# Patient Record
Sex: Male | Born: 1965 | Race: Asian | Hispanic: No | Marital: Married | Smoking: Never smoker
Health system: Southern US, Community
[De-identification: ages and names within clinical notes are randomized; demographics above are authoritative.]

## PROBLEM LIST (undated history)

## (undated) DIAGNOSIS — I2510 Atherosclerotic heart disease of native coronary artery without angina pectoris: Secondary | ICD-10-CM

## (undated) DIAGNOSIS — Q230 Congenital stenosis of aortic valve: Secondary | ICD-10-CM

## (undated) DIAGNOSIS — Z951 Presence of aortocoronary bypass graft: Secondary | ICD-10-CM

## (undated) DIAGNOSIS — Q231 Congenital insufficiency of aortic valve: Secondary | ICD-10-CM

## (undated) DIAGNOSIS — Z952 Presence of prosthetic heart valve: Secondary | ICD-10-CM

## (undated) HISTORY — DX: Congenital stenosis of aortic valve: Q23.0

## (undated) HISTORY — DX: Presence of prosthetic heart valve: Z95.2

## (undated) HISTORY — DX: Presence of aortocoronary bypass graft: Z95.1

## (undated) HISTORY — DX: Atherosclerotic heart disease of native coronary artery without angina pectoris: I25.10

## (undated) HISTORY — DX: Congenital insufficiency of aortic valve: Q23.1

---

## 2017-05-22 HISTORY — PX: OTHER SURGICAL HISTORY: SHX169

## 2017-06-06 DIAGNOSIS — Z952 Presence of prosthetic heart valve: Secondary | ICD-10-CM

## 2017-06-06 HISTORY — DX: Presence of prosthetic heart valve: Z95.2

## 2017-06-08 HISTORY — PX: AORTIC VALVE REPLACEMENT (AVR)/CORONARY ARTERY BYPASS GRAFTING (CABG): SHX5725

## 2017-08-01 ENCOUNTER — Ambulatory Visit (INDEPENDENT_AMBULATORY_CARE_PROVIDER_SITE_OTHER): Payer: 59 | Admitting: Cardiovascular Disease

## 2017-08-01 ENCOUNTER — Encounter: Payer: Self-pay | Admitting: Cardiovascular Disease

## 2017-08-01 ENCOUNTER — Telehealth: Payer: Self-pay

## 2017-08-01 DIAGNOSIS — I2510 Atherosclerotic heart disease of native coronary artery without angina pectoris: Secondary | ICD-10-CM

## 2017-08-01 DIAGNOSIS — I350 Nonrheumatic aortic (valve) stenosis: Secondary | ICD-10-CM | POA: Diagnosis not present

## 2017-08-01 DIAGNOSIS — Z951 Presence of aortocoronary bypass graft: Secondary | ICD-10-CM | POA: Diagnosis not present

## 2017-08-01 NOTE — Telephone Encounter (Signed)
Faxed notes to nl 

## 2017-08-01 NOTE — Assessment & Plan Note (Signed)
Mr. [place] had coronary artery bypass grafting 4 on 06/06/17 [facility] with a LIMA to his LAD, vein to obtuse marginal branch, ramus branch and PDA.. This was done 3 days after cardiac catheterization revealed left main/three-vessel disease. His EF improved from 46% preoperative to 55%.Marland Kitchen. He did complete cardiac rehabilitation and is minimally symptomatic now

## 2017-08-01 NOTE — Patient Instructions (Signed)
Medication Instructions: Your physician recommends that you continue on your current medications as directed. Please refer to the Current Medication list given to you today.  Labwork: Your physician recommends that you return for a FASTING lipid profile and hepatic function panel at your earliest convenience.  Testing/Procedures: Your physician has requested that you have an echocardiogram. Echocardiography is a painless test that uses sound waves to create images of your heart. It provides your doctor with information about the size and shape of your heart and how well your heart's chambers and valves are working. This procedure takes approximately one hour. There are no restrictions for this procedure.  Follow-Up: Your physician wants you to follow-up in: 6 months with Dr. Berry. You will receive a reminder letter in the mail two months in advance. If you don't receive a letter, please call our office to schedule the follow-up appointment.  If you need a refill on your cardiac medications before your next appointment, please call your pharmacy.  

## 2017-08-01 NOTE — Progress Notes (Signed)
08/01/2017 [place]   16-Jun-1966  [id number]  Primary Physician System, Pcp Not In Primary Cardiologist: Runell GessJonathan J Izumi Mixon MD Nicholes CalamityFACP, FACC, FAHA, [place]  HPI:  [place] is a 52 y.o. thin-appearing married Filipino male father of one daughter who is accompanied by his wife [place]. He was referred to be established in my card and asked practice for ongoing care after recent coronary artery bypass grafting and aortic valve replacement. He has no prior cardiac history. He has no cardiac risk factors. [place] at [place] at [employer] and commutes back and forth every 2 weeks. He noticed increasing shortness of breath and was seen by cardiologist there (Dr. Donette LarryMichael Benz) who performed cardiac catheterization on 06/03/17 revealing left main/three-vessel disease. A 2-D echo did reveal severe bicuspid aortic stenosis. Previously on 06/06/17 he had coronary artery bypass grafting 4 with a LIMA to his LAD, vein to obtuse marginal branch, PDA and ramus branch. He also had a 23 mm Inspiris  pericardial tissue valve placed. He completed cardiac rehabilitation successfully. His breathing has improved since his surgical procedure. He currently denies chest pain or shortness of breath.   Current Meds  Medication Sig  . aspirin EC 81 MG tablet Take 81 mg by mouth daily.  Marland Kitchen. atorvastatin (LIPITOR) 20 MG tablet Take 1 tablet by mouth daily.  . ferrous sulfate 325 (65 FE) MG tablet Take 325 mg by mouth daily with breakfast.  . FOLIC ACID PO Take 1,000 mg by mouth daily.  . furosemide (LASIX) 20 MG tablet Take 20 mg by mouth daily.  . metoprolol succinate (TOPROL-XL) 50 MG 24 hr tablet Take 1 tablet by mouth daily.  . Multiple Vitamin (MULTIVITAMIN) tablet Take 1 tablet by mouth daily.     Not on File  Social History   Socioeconomic History  . Marital status: Married    Spouse name: Not on file  . Number of children: Not on file  . Years of education: Not on file  . Highest  education level: Not on file  Social Needs  . Financial resource strain: Not on file  . Food insecurity - worry: Not on file  . Food insecurity - inability: Not on file  . Transportation needs - medical: Not on file  . Transportation needs - non-medical: Not on file  Occupational History  . Not on file  Tobacco Use  . Smoking status: Never Smoker  . Smokeless tobacco: Never Used  Substance and Sexual Activity  . Alcohol use: Not on file  . Drug use: Not on file  . Sexual activity: Not on file  Other Topics Concern  . Not on file  Social History Narrative  . Not on file     Review of Systems: General: negative for chills, fever, night sweats or weight changes.  Cardiovascular: negative for chest pain, dyspnea on exertion, edema, orthopnea, palpitations, paroxysmal nocturnal dyspnea or shortness of breath Dermatological: negative for rash Respiratory: negative for cough or wheezing Urologic: negative for hematuria Abdominal: negative for nausea, vomiting, diarrhea, bright red blood per rectum, melena, or hematemesis Neurologic: negative for visual changes, syncope, or dizziness All other systems reviewed and are otherwise negative except as noted above.    Blood pressure 138/84, pulse 65, height 5\' 6"  (1.676 m), weight 141 lb 9.6 oz (64.2 kg).  General appearance: alert and no distress Neck: no adenopathy, no carotid bruit, no JVD, supple, symmetrical, trachea midline and thyroid not enlarged, symmetric, no tenderness/mass/nodules Lungs: clear to auscultation  bilaterally Heart: regular rate and rhythm, S1, S2 normal, no murmur, click, rub or gallop Extremities: extremities normal, atraumatic, no cyanosis or edema Pulses: 2+ and symmetric Skin: Skin color, texture, turgor normal. No rashes or lesions Neurologic: Alert and oriented X 3, normal strength and tone. Normal symmetric reflexes. Normal coordination and gait  EKG sinus rhythm at 65 with evidence of LVH with  repolarization changes. I personally reviewed this EKG.  ASSESSMENT AND PLAN:   Hx of CABG Mr. Mcshan had coronary artery bypass grafting 4 on 06/06/17 at [facility] with a LIMA to his LAD, vein to obtuse marginal branch, ramus branch and PDA.. This was done 3 days after cardiac catheterization revealed left main/three-vessel disease. His EF improved from 46% preoperative to 55%.Marland Kitchen He did complete cardiac rehabilitation and is minimally symptomatic now  Aortic valve stenosis, severe History of severe bicuspid aortic stenosis status post aortic valve replacement with a 23 mm Inspiris pericardial tissue valve at the time of bypass surgery 06/06/17. I'm going to repeat a 2-D echocardiogram as his new baseline and he will need annual 2-D echos to follow his valve.      Runell Gess MD FACP,FACC,FAHA, [facility] 08/01/2017 1:44 PM

## 2017-08-01 NOTE — Assessment & Plan Note (Addendum)
History of severe bicuspid aortic stenosis status post aortic valve replacement with a 23 mm Inspiris pericardial tissue valve at the time of bypass surgery 06/06/17. I'm going to repeat a 2-D echocardiogram as his new baseline and he will need annual 2-D echos to follow his valve.

## 2017-08-05 LAB — HEPATIC FUNCTION PANEL
ALT: 43 IU/L (ref 0–44)
AST: 28 IU/L (ref 0–40)
Albumin: 4.2 g/dL (ref 3.5–5.5)
Alkaline Phosphatase: 81 IU/L (ref 39–117)
Bilirubin Total: 0.4 mg/dL (ref 0.0–1.2)
Bilirubin, Direct: 0.11 mg/dL (ref 0.00–0.40)
TOTAL PROTEIN: 7.1 g/dL (ref 6.0–8.5)

## 2017-08-05 LAB — LIPID PANEL
CHOLESTEROL TOTAL: 127 mg/dL (ref 100–199)
Chol/HDL Ratio: 3.3 ratio (ref 0.0–5.0)
HDL: 39 mg/dL — ABNORMAL LOW (ref >39)
LDL CALC: 73 mg/dL (ref 0–99)
TRIGLYCERIDES: 75 mg/dL (ref 0–149)
VLDL CHOLESTEROL CAL: 15 mg/dL (ref 5–40)

## 2017-08-12 ENCOUNTER — Ambulatory Visit (HOSPITAL_COMMUNITY): Payer: 59 | Attending: Cardiology

## 2017-08-12 ENCOUNTER — Other Ambulatory Visit: Payer: Self-pay

## 2017-08-12 ENCOUNTER — Encounter: Payer: Self-pay | Admitting: Cardiovascular Disease

## 2017-08-12 DIAGNOSIS — I517 Cardiomegaly: Secondary | ICD-10-CM | POA: Diagnosis not present

## 2017-08-12 DIAGNOSIS — I340 Nonrheumatic mitral (valve) insufficiency: Secondary | ICD-10-CM | POA: Insufficient documentation

## 2017-08-12 DIAGNOSIS — Z951 Presence of aortocoronary bypass graft: Secondary | ICD-10-CM | POA: Diagnosis not present

## 2017-08-12 DIAGNOSIS — I350 Nonrheumatic aortic (valve) stenosis: Secondary | ICD-10-CM | POA: Insufficient documentation

## 2017-08-12 DIAGNOSIS — Z953 Presence of xenogenic heart valve: Secondary | ICD-10-CM | POA: Insufficient documentation

## 2017-08-19 HISTORY — PX: OTHER SURGICAL HISTORY: SHX169

## 2017-08-28 ENCOUNTER — Other Ambulatory Visit: Payer: Self-pay | Admitting: Cardiovascular Disease

## 2017-08-28 DIAGNOSIS — Z951 Presence of aortocoronary bypass graft: Secondary | ICD-10-CM

## 2017-08-28 DIAGNOSIS — I2510 Atherosclerotic heart disease of native coronary artery without angina pectoris: Secondary | ICD-10-CM

## 2017-08-28 DIAGNOSIS — I350 Nonrheumatic aortic (valve) stenosis: Secondary | ICD-10-CM

## 2017-09-05 ENCOUNTER — Telehealth: Payer: Self-pay | Admitting: Cardiovascular Disease

## 2017-09-05 NOTE — Telephone Encounter (Signed)
09/05/17 - Patient dropped off Absence Certificate [employer] Form for Dr. Allyson SabalBerry to complete/sign. [facility] CMA. 09/05/17 ab   09/15/17 - Received signed form back from Dr. Allyson SabalBerry. Form has been faxed. Spoke with patient. Patient is picking up copy. 09/15/17  09/15/17 - Patient picked up signed form. 09/15/17 ab

## 2017-09-09 ENCOUNTER — Other Ambulatory Visit: Payer: Self-pay | Admitting: Cardiovascular Disease

## 2017-09-09 MED ORDER — ATORVASTATIN CALCIUM 20 MG PO TABS: 20.0000 mg | ORAL_TABLET | Freq: Every day | ORAL | 6 refills | Status: DC

## 2017-10-08 ENCOUNTER — Encounter: Payer: Self-pay | Admitting: Adult Health

## 2017-10-08 NOTE — Progress Notes (Signed)
Cardiology Office Note   Date:  10/13/2017   ID:  [place], DOB Jul 12, 1966, MRN [id number]  PCP:  System, Pcp Not In  Cardiologist: Dr. Allyson Sabal  Chief Complaint  Patient presents with  . Coronary Artery Disease    CABG 2018   . Aortic Stenosis    S/P Pericardial tissue valve 05/2017     History of Present Illness: [place] is a 52 y.o. male who presents for ongoing assessment and management of coronary artery disease with history of CABG and aortic valve replacement. (Dr. Donette Larry, [facility] [place], [place] performed cardiac catheterization on 06/03/17 revealing left main/three-vessel disease.   A 2-D echo did reveal severe bicuspid aortic stenosis. Previously on 06/06/17 he had coronary artery bypass grafting 4 with a LIMA to his LAD, vein to obtuse marginal branch, PDA and ramus branch. He also had a 23 mm Inspiris  pericardial tissue valve placed.  The patient works for [employer] and commutes every 2 weeks.  The patient was last seen by Dr. Allyson Sabal on 08/01/2017 and was doing well without any complaints.  He is medically compliant.  He is without complaints today.  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  He works for [employer].  He sometimes has to lift greater than 70 pounds.  He would like to go back to work with weight restriction, as he also states that [place] has a program to allow him to "ease in" as he progresses with his healing process and continue to work a with reduced weight.  They bring with them a form to be filled out today.   No past medical history on file.  Past Surgical History:  Procedure Laterality Date  . AORTIC VALVE REPLACEMENT (AVR)/CORONARY ARTERY BYPASS GRAFTING (CABG)  06/08/2017   LIMA to LAD, SVG to obtuse marginal branch, PDA to ramus branch.  23 mm InspirEase pericardial tissue valve  . Grade 2 diastolic function  08/19/2017   Per echo  .  Hypercholesterolemia  05/2017     Current Outpatient Medications  Medication Sig Dispense Refill  . aspirin EC 81 MG tablet Take 81 mg by mouth daily.    Marland Kitchen atorvastatin (LIPITOR) 20 MG tablet Take 1 tablet (20 mg total) by mouth daily. 30 tablet 6  . ferrous sulfate 325 (65 FE) MG tablet Take 325 mg by mouth daily with breakfast.    . FOLIC ACID PO Take 1,000 mg by mouth daily.    . furosemide (LASIX) 20 MG tablet Take 20 mg by mouth daily.    . metoprolol succinate (TOPROL-XL) 50 MG 24 hr tablet Take 1 tablet by mouth daily.    . Multiple Vitamin (MULTIVITAMIN) tablet Take 1 tablet by mouth daily.     No current facility-administered medications for this visit.     Allergies:   Patient has no known allergies.    Social History:  The patient  reports that he has never smoked. He has never used smokeless tobacco.   Family History:  The patient's family history is not on file.    ROS: All other systems are reviewed and negative. Unless otherwise mentioned in H&P    PHYSICAL EXAM: VS:  BP 140/85   Pulse 62   Ht 5\' 6"  (1.676 m)   Wt 142 lb 9.6 oz (64.7 kg)   BMI 23.02 kg/m  , BMI Body mass index is 23.02 kg/m. GEN: Well nourished, well  developed, in no acute distress  HEENT: normal  Neck: no JVD, carotid bruits, or masses Cardiac: RRR; no murmurs, rubs, or gallops, nonpitting bilateral pretibial edema  Respiratory:  clear to auscultation bilaterally, normal work of breathing GI: soft, nontender, nondistended, + BS MS: no deformity or atrophy  Skin: warm and dry, no rash Neuro:  Strength and sensation are intact Psych: euthymic mood, full affect   Recent Labs: 08/05/2017: ALT 43    Lipid Panel    Component Value Date/Time   CHOL 127 08/05/2017 0833   TRIG 75 08/05/2017 0833   HDL 39 (L) 08/05/2017 0833   CHOLHDL 3.3 08/05/2017 0833   LDLCALC 73 08/05/2017 0833      Wt Readings from Last 3 Encounters:  10/13/17 142 lb 9.6 oz (64.7 kg)  08/01/17 141 lb 9.6 oz  (64.2 kg)      Other studies Reviewed: Echocardiogram 08/12/2017 HPI and indications: AVR (23mm Inspiris pericardial tissue valve) - Left ventricle: The cavity size was normal. There was moderate   concentric hypertrophy. Systolic function was normal. The   estimated ejection fraction was in the range of 55% to 60%. Wall   motion was normal; there were no regional wall motion   abnormalities. Features are consistent with a pseudonormal left   ventricular filling pattern, with concomitant abnormal relaxation   and increased filling pressure (grade 2 diastolic dysfunction). - Aortic valve: A bioprosthesis was present. Peak velocity (S): 226   cm/s. Valve area (VTI): 1.02 cm^2. Valve area (Vmax): 0.97 cm^2.   Valve area (Vmean): 0.95 cm^2. - Mitral valve: There was mild regurgitation.  ASSESSMENT AND PLAN:  1.  Coronary artery disease: History of coronary artery bypass [place] in November 2018, [place] to his LAD, vein to obtuse marginal branch, PDA and ramus branch.  The patient continues on metoprolol XL 50 mg daily, aspirin 81 mg daily and low-dose Lasix 20 mg daily.  BMET is being ordered to evaluate kidney function.  2.  Status post aortic valve repair: Completed during CABG in 05/2017, this is a pericardial tissue valve  a 23 mm Inspiris  pericardial tissue valve.  He is doing well without any complaints.  3.  Hypercholesterolemia: Patient is on atorvastatin 20 mg daily.  Will repeat fasting lipids and LFTs today.  4.  Return to work: Return to work form is completed he is not to lift greater than 40 pounds.  He works as a Consulting civil engineerbaggage handler, and states that they do have a program at his job site that allows him to ease into he is a former activity level slowly.  He may be able to scan luggage well as with smaller items.  Recommend this.  Current medicines are reviewed at length with the patient today.    Labs/ tests ordered today include: BMET, fasting lipids and  LFTs.  Bettey MareKathryn M. Liborio NixonLawrence DNP, ANP, AACC   10/13/2017 8:23 AM    [facility] [address], [place], [place] [address] Phone: [phone]; Fax: [phone]

## 2017-10-13 ENCOUNTER — Encounter: Payer: Self-pay | Admitting: Adult Health

## 2017-10-13 ENCOUNTER — Ambulatory Visit (INDEPENDENT_AMBULATORY_CARE_PROVIDER_SITE_OTHER): Payer: 59 | Admitting: Adult Health

## 2017-10-13 VITALS — BP 140/85 | HR 62 | Ht 66.0 in | Wt 142.6 lb

## 2017-10-13 DIAGNOSIS — Z79899 Other long term (current) drug therapy: Secondary | ICD-10-CM

## 2017-10-13 DIAGNOSIS — I350 Nonrheumatic aortic (valve) stenosis: Secondary | ICD-10-CM

## 2017-10-13 DIAGNOSIS — E7800 Pure hypercholesterolemia, unspecified: Secondary | ICD-10-CM | POA: Diagnosis not present

## 2017-10-13 DIAGNOSIS — I2510 Atherosclerotic heart disease of native coronary artery without angina pectoris: Secondary | ICD-10-CM | POA: Diagnosis not present

## 2017-10-13 LAB — BASIC METABOLIC PANEL
BUN/Creatinine Ratio: 11 (ref 9–20)
BUN: 11 mg/dL (ref 6–24)
CALCIUM: 9.3 mg/dL (ref 8.7–10.2)
CO2: 26 mmol/L (ref 20–29)
CREATININE: 0.98 mg/dL (ref 0.76–1.27)
Chloride: 101 mmol/L (ref 96–106)
GFR calc non Af Amer: 89 mL/min/{1.73_m2} (ref >59)
GFR, EST AFRICAN AMERICAN: 103 mL/min/{1.73_m2} (ref >59)
Glucose: 89 mg/dL (ref 65–99)
Potassium: 4.3 mmol/L (ref 3.5–5.2)
Sodium: 139 mmol/L (ref 134–144)

## 2017-10-13 LAB — HEPATIC FUNCTION PANEL
ALT: 55 IU/L — AB (ref 0–44)
AST: 32 IU/L (ref 0–40)
Albumin: 4.4 g/dL (ref 3.5–5.5)
Alkaline Phosphatase: 65 IU/L (ref 39–117)
BILIRUBIN TOTAL: 0.3 mg/dL (ref 0.0–1.2)
BILIRUBIN, DIRECT: 0.1 mg/dL (ref 0.00–0.40)
Total Protein: 7.3 g/dL (ref 6.0–8.5)

## 2017-10-13 NOTE — Patient Instructions (Addendum)
Medication Instructions:  NO CHANGES- Your physician recommends that you continue on your current medications as directed. Please refer to the Current Medication list given to you today.  If you need a refill on your cardiac medications before your next appointment, please call your pharmacy.  Lab: BMET AND LFT'S TODAY HERE IN OUR OFFICE  Follow-Up: Your physician wants you to follow-up in: June WITH DR Allyson SabalBERRY.    Thank you for choosing [facility] at [facility]!!

## 2017-12-30 ENCOUNTER — Encounter: Payer: Self-pay | Admitting: Cardiovascular Disease

## 2017-12-30 ENCOUNTER — Ambulatory Visit (INDEPENDENT_AMBULATORY_CARE_PROVIDER_SITE_OTHER): Payer: 59 | Admitting: Cardiovascular Disease

## 2017-12-30 VITALS — BP 126/90 | HR 76 | Ht 66.0 in | Wt 141.0 lb

## 2017-12-30 DIAGNOSIS — R9439 Abnormal result of other cardiovascular function study: Secondary | ICD-10-CM

## 2017-12-30 DIAGNOSIS — R931 Abnormal findings on diagnostic imaging of heart and coronary circulation: Secondary | ICD-10-CM

## 2017-12-30 NOTE — Progress Notes (Signed)
12/30/2017 [place]   [birth year]  [id number]  Primary Physician System, Pcp Not In Primary Cardiologist: [place] GessJonathan J Berry MD Scott CalamityFACP, FACC, FAHA, [place]  HPI:  [place] is a 52 y.o.   thin-appearing married Filipino male father of one daughter who is accompanied by his wife [place].   I last saw him in the office 08/01/2017.  He was referred to be established in my card and asked practice for ongoing care after recent coronary artery bypass grafting and aortic valve replacement. He has no prior cardiac history. He has no cardiac risk factors. He works at [place] at [employer] and commutes back and forth every 2 weeks. He noticed increasing shortness of breath and was seen by cardiologist there (Dr. Donette LarryMichael [place]) who performed cardiac catheterization on 06/03/17 revealing left main/three-vessel disease. A 2-D echo did reveal severe bicuspid aortic stenosis. Previously on 06/06/17 he had coronary artery bypass grafting 4 with a LIMA to his LAD, vein to obtuse marginal branch, PDA and ramus branch. He also had a 23 mm Inspiris  pericardial tissue valve placed. He completed cardiac rehabilitation successfully. His breathing has improved since his surgical procedure. He currently denies chest pain or shortness of breath. 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  He recently had a 2D echo performed by his cardiologist up there that was essentially normal however a Myoview stress test showed anteroapical ischemia despite being asymptomatic.     Current Meds  Medication Sig  . aspirin EC 81 MG tablet Take 81 mg by mouth daily.  Marland Kitchen. atorvastatin (LIPITOR) 20 MG tablet Take 1 tablet (20 mg total) by mouth daily.  . ferrous sulfate 325 (65 FE) MG tablet Take 325 mg by mouth daily with breakfast.  . FOLIC ACID PO Take 1,000 mg by mouth daily.  . furosemide (LASIX) 20 MG tablet Take 20 mg by mouth daily.  Marland Kitchen. lisinopril (PRINIVIL,ZESTRIL) 5 MG tablet Take 5 mg by mouth  daily.  . metoprolol succinate (TOPROL-XL) 50 MG 24 hr tablet Take 1 tablet by mouth daily.  . Multiple Vitamin (MULTIVITAMIN) tablet Take 1 tablet by mouth daily.     No Known Allergies  Social History   Socioeconomic History  . Marital status: Married    Spouse name: Not on file  . Number of children: Not on file  . Years of education: Not on file  . Highest education level: Not on file  Occupational History  . Not on file  Social Needs  . Financial resource strain: Not on file  . Food insecurity:    Worry: Not on file    Inability: Not on file  . Transportation needs:    Medical: Not on file    Non-medical: Not on file  Tobacco Use  . Smoking status: Never Smoker  . Smokeless tobacco: Never Used  Substance and Sexual Activity  . Alcohol use: Not on file  . Drug use: Not on file  . Sexual activity: Not on file  Lifestyle  . Physical activity:    Days per week: Not on file    Minutes per session: Not on file  . Stress: Not on file  Relationships  . Social connections:    Talks on phone: Not on file    Gets together: Not on file    Attends religious service: Not on file    Active member of club or organization: Not on file    Attends meetings of clubs or  organizations: Not on file    Relationship status: Not on file  . Intimate partner violence:    Fear of current or ex partner: Not on file    Emotionally abused: Not on file    Physically abused: Not on file    Forced sexual activity: Not on file  Other Topics Concern  . Not on file  Social History Narrative  . Not on file     Review of Systems: General: negative for chills, fever, night sweats or weight changes.  Cardiovascular: negative for chest pain, dyspnea on exertion, edema, orthopnea, palpitations, paroxysmal nocturnal dyspnea or shortness of breath Dermatological: negative for rash Respiratory: negative for cough or wheezing Urologic: negative for hematuria Abdominal: negative for nausea,  vomiting, diarrhea, bright red blood per rectum, melena, or hematemesis Neurologic: negative for visual changes, syncope, or dizziness All other systems reviewed and are otherwise negative except as noted above.    Blood pressure 126/90, pulse 76, height 5\' 6"  (1.676 m), weight 141 lb (64 kg).  General appearance: alert and no distress Neck: no adenopathy, no carotid bruit, no JVD, supple, symmetrical, trachea midline and thyroid not enlarged, symmetric, no tenderness/mass/nodules Lungs: clear to auscultation bilaterally Heart: regular rate and rhythm, S1, S2 normal, no murmur, click, rub or gallop Extremities: extremities normal, atraumatic, no cyanosis or edema Pulses: 2+ and symmetric Skin: Skin color, texture, turgor normal. No rashes or lesions Neurologic: Alert and oriented X 3, normal strength and tone. Normal symmetric reflexes. Normal coordination and gait  EKG not performed today  ASSESSMENT AND PLAN:   Hx of CABG History of CAD status post coronary artery bypass grafting times 411/16/18 with a LIMA to the LAD, vein to the obtuse marginal branch, PDA and ramus branch.  He is completely asymptomatic.  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  He recently had a 2D echo that showed normal functioning aortic prosthesis and normal LV function however a Myoview stress test suggested anterior and apical ischemia.  He is asymptomatic.  He cannot go back to work with this stress test results.  Therefore, I am going to repeat a stress test.  If this is abnormal he will need diagnostic coronary angiography.  If this is normal he can be released back to work at low risk.  Aortic valve stenosis, severe History of severe aortic stenosis status post aortic valve replacement with a 23 mm Inspiris pericardial tissue valve the time of bypass grafting 06/06/2017.  Recent 2D echo performed by cardiologist up in [place] 12/10/2017 revealed normal LV systolic function with a  well-functioning aortic bioprosthesis.      [place] Gess MD FACP,FACC,FAHA, [facility] 12/30/2017 4:09 PM

## 2017-12-30 NOTE — Assessment & Plan Note (Signed)
History of severe aortic stenosis status post aortic valve replacement with a 23 mm Inspiris pericardial tissue valve the time of bypass grafting 06/06/2017.  Recent 2D echo performed by cardiologist [place] in [place] 12/10/2017 revealed normal LV systolic function with a well-functioning aortic bioprosthesis.

## 2017-12-30 NOTE — Assessment & Plan Note (Signed)
History of CAD status post coronary artery bypass grafting times 411/16/18 with a LIMA to the LAD, vein to the obtuse marginal branch, PDA and ramus branch.  He is completely asymptomatic.  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  He recently had a 2D echo that showed normal functioning aortic prosthesis and normal LV function however a Myoview stress test suggested anterior and apical ischemia.  He is asymptomatic.  He cannot go back to work with this stress test results.  Therefore, I am going to repeat a stress test.  If this is abnormal he will need diagnostic coronary angiography.  If this is normal he can be released back to work at low risk.

## 2017-12-30 NOTE — Patient Instructions (Signed)
Medication Instructions:  Your physician recommends that you continue on your current medications as directed. Please refer to the Current Medication list given to you today.  Labwork: None   Testing/Procedures: Your physician has requested that you have en exercise stress myoview. For further information please visit [email or web address]. Please follow instruction sheet, as given.  Follow-Up: Your physician wants you to follow-up in: 6 months with Dr Allyson SabalBerry. You will receive a reminder letter in the mail two months in advance. If you don't receive a letter, please call our office to schedule the follow-up appointment.  Any Other Special Instructions Will Be Listed Below (If Applicable). If you need a refill on your cardiac medications before your next appointment, please call your pharmacy.

## 2017-12-31 ENCOUNTER — Encounter: Payer: Self-pay | Admitting: Cardiovascular Disease

## 2018-01-02 ENCOUNTER — Encounter (HOSPITAL_COMMUNITY): Payer: 59

## 2018-01-07 ENCOUNTER — Telehealth: Payer: Self-pay | Admitting: Cardiovascular Disease

## 2018-01-07 NOTE — Telephone Encounter (Signed)
New Message:       [place] [facility] for this pt. I have reached out to Delrae AlfredAshley Wertz but she stated I needed to reach out to RN. He states the pt had the same test done in may 2019 and now another one is being req.

## 2018-01-07 NOTE — Telephone Encounter (Signed)
Left message to call back  

## 2018-01-08 ENCOUNTER — Telehealth (HOSPITAL_COMMUNITY): Payer: Self-pay

## 2018-01-08 NOTE — Telephone Encounter (Signed)
A REPRESENTATIVE FROM [facility] - INFORMING  OFFICE THAT PATIENT CAN HAVE MYOVIEW IF INFORMATION IS SUBMITTED - WITH DIAGNOSIS OF WHY HAVING MYOVIEW IS   LESS THAN A YEAR  HE STATES WIFE HAD CALLED [employer]. REVIEWING CHART -TEST HASBEEN AUTHORIZE ALREADY [place]

## 2018-01-08 NOTE — Telephone Encounter (Signed)
LMTCB

## 2018-01-08 NOTE — Telephone Encounter (Signed)
Encounter complete. 

## 2018-01-13 ENCOUNTER — Ambulatory Visit (HOSPITAL_COMMUNITY)
Admission: RE | Admit: 2018-01-13 | Discharge: 2018-01-13 | Disposition: A | Discharge status: Home / Self Care | Payer: 59 | Source: Ambulatory Visit | Attending: Cardiovascular Disease | Admitting: Cardiovascular Disease

## 2018-01-13 DIAGNOSIS — R9439 Abnormal result of other cardiovascular function study: Secondary | ICD-10-CM

## 2018-01-13 DIAGNOSIS — R931 Abnormal findings on diagnostic imaging of heart and coronary circulation: Secondary | ICD-10-CM | POA: Diagnosis not present

## 2018-01-13 LAB — MYOCARDIAL PERFUSION IMAGING
CHL CUP NUCLEAR SDS: 5
CHL CUP NUCLEAR SRS: 1
CHL CUP NUCLEAR SSS: 6
CHL CUP RESTING HR STRESS: 64 {beats}/min
CSEPEDS: 46 s
CSEPEW: 11.8 METS
CSEPHR: 95 %
CSEPPHR: 162 {beats}/min
Exercise duration (min): 10 min
LV dias vol: 128 mL (ref 62–150)
LV sys vol: 67 mL
MPHR: 169 {beats}/min
RPE: 19
TID: 1.15

## 2018-01-13 MED ORDER — TECHNETIUM TC 99M TETROFOSMIN IV KIT
10.2000 | PACK | Freq: Once | INTRAVENOUS | Status: AC | PRN
  Administered 2018-01-13: 10.2 via INTRAVENOUS
  Filled 2018-01-13: qty 11

## 2018-01-13 MED ORDER — TECHNETIUM TC 99M TETROFOSMIN IV KIT
31.0000 | PACK | Freq: Once | INTRAVENOUS | Status: AC | PRN
  Administered 2018-01-13: 31 via INTRAVENOUS
  Filled 2018-01-13: qty 31

## 2018-01-14 ENCOUNTER — Encounter: Payer: Self-pay | Admitting: Cardiovascular Disease

## 2018-01-14 ENCOUNTER — Ambulatory Visit (INDEPENDENT_AMBULATORY_CARE_PROVIDER_SITE_OTHER): Payer: 59 | Admitting: Cardiovascular Disease

## 2018-01-14 VITALS — BP 136/88 | HR 75 | Ht 66.5 in | Wt 140.8 lb

## 2018-01-14 DIAGNOSIS — R9439 Abnormal result of other cardiovascular function study: Secondary | ICD-10-CM | POA: Diagnosis not present

## 2018-01-14 NOTE — Progress Notes (Signed)
Mr. Scott Shepard returns today to follow-up his Myoview stress test that was performed 01/13/2018 that showed anterolateral ischemia.  This is consistent with his recent stress test performed up in [place].  He had coronary artery bypass grafting 06/03/2017 after cath showed left main three-vessel disease and severe aortic stenosis from bicuspid aortic valve.  He ended up he had a LIMA to his LAD, vein to obtuse marginal branch, PDA and ramus branch as well as a 23 mm Inspiris pericardial tissue valve.  He is totally asymptomatic but given his Myoview abnormality he will need to undergo cardiac catheterization to define his anatomy.The patient understands that risks included but are not limited to stroke (1 in 1000), death (1 in 1000), kidney failure [usually temporary] (1 in 500), bleeding (1 in 200), allergic reaction [possibly serious] (1 in 200). The patient understands and agrees to proceed   Runell GessJonathan J. Hoke Baer, M.D., FACP, [facility] HospitalFACC, Kathryne ErikssonFAHA, FSCAI [facility] [address] [place], [place]  [address]  [phone] 01/14/2018 12:20 PM

## 2018-01-14 NOTE — Patient Instructions (Addendum)
   [facility] [address] [place] [address] [place] [address] Dept: [phone] Loc: [phone]  Scott IlesHipolito Shepard  01/14/2018  You are scheduled for a Cardiac Catheterization on Monday, July 1 with Dr. Nanetta BattyJonathan Shepard.  1. Please [facility] the [facility]) at [facility]: [address] [place], [place] [address] at 7:30 AM (two hours before your procedure to ensure your preparation). Free valet parking service is available.   Special note: Every effort is made to have your procedure done on time. Please understand that emergencies sometimes delay scheduled procedures.  2. Diet: Do not eat or drink anything after midnight prior to your procedure except sips of water to take medications.  3. Labs: Your labs will be performed at the [facility] after you arrive for your procedure.  A chest x-ray takes a picture of the organs and structures inside the chest, including the heart, lungs, and blood vessels. This test can show several things, including, whether the heart is enlarges; whether fluid is building up in the lungs; and whether pacemaker / defibrillator leads are still in place. AT [facility]  4. Medication instructions in preparation for your procedure:  DO NOT TAKE FUROSEMIDE THE MORNING OF THE PROCEDURE  On the morning of your procedure, take your Aspirin and any morning medicines NOT listed above.  You may use sips of water.  5. Plan for one night stay--bring personal belongings. 6. Bring a current list of your medications and current insurance cards. 7. You MUST have a responsible person to drive you home. 8. Someone MUST be with you the first 24 hours after you arrive home or your discharge will be delayed. 9. Please wear clothes that are easy to get on and off and wear slip-on shoes.  Thank you for allowing us to care for you!   -- [facility]  services

## 2018-01-14 NOTE — H&P (View-Only) (Signed)
Scott Shepard returns today to follow-up his Myoview stress test that was performed 01/13/2018 that showed anterolateral ischemia.  This is consistent with his recent stress test performed up in [place].  He had coronary artery bypass grafting 06/03/2017 after cath showed left main three-vessel disease and severe aortic stenosis from bicuspid aortic valve.  He ended up he had a LIMA to his LAD, vein to obtuse marginal branch, PDA and ramus branch as well as a 23 mm Inspiris pericardial tissue valve.  He is totally asymptomatic but given his Myoview abnormality he will need to undergo cardiac catheterization to define his anatomy.The patient understands that risks included but are not limited to stroke (1 in 1000), death (1 in 1000), kidney failure [usually temporary] (1 in 500), bleeding (1 in 200), allergic reaction [possibly serious] (1 in 200). The patient understands and agrees to proceed   [name] J. Mckay Brandt, M.D., FACP, FACC, FAHA, FSCAI  [facility] [address] [place], [place]  [address]  [phone] 01/14/2018 12:20 PM  

## 2018-01-14 NOTE — Assessment & Plan Note (Signed)
Mr. Scott Shepard returns today to follow-up his Myoview stress test that was performed 01/13/2018 that showed anterolateral ischemia.  This is consistent with his recent stress test performed up in [place].  He had coronary artery bypass grafting 06/03/2017 after cath showed left main three-vessel disease and severe aortic stenosis from bicuspid aortic valve.  He ended up he had a LIMA to his LAD, vein to obtuse marginal branch, PDA and ramus branch as well as a 23 mm Inspiris pericardial tissue valve.  He is totally asymptomatic but given his Myoview abnormality he will need to undergo cardiac catheterization to define his anatomy.The patient understands that risks included but are not limited to stroke (1 in 1000), death (1 in 1000), kidney failure [usually temporary] (1 in 500), bleeding (1 in 200), allergic reaction [possibly serious] (1 in 200). The patient understands and agrees to proceed

## 2018-01-15 ENCOUNTER — Telehealth: Payer: Self-pay

## 2018-01-15 ENCOUNTER — Ambulatory Visit (HOSPITAL_COMMUNITY)
Admission: RE | Admit: 2018-01-15 | Discharge: 2018-01-15 | Disposition: A | Discharge status: Home / Self Care | Payer: 59 | Source: Ambulatory Visit | Attending: Cardiovascular Disease | Admitting: Cardiovascular Disease

## 2018-01-15 DIAGNOSIS — R9439 Abnormal result of other cardiovascular function study: Secondary | ICD-10-CM | POA: Insufficient documentation

## 2018-01-15 LAB — BASIC METABOLIC PANEL
BUN/Creatinine Ratio: 14 (ref 9–20)
BUN: 13 mg/dL (ref 6–24)
CALCIUM: 10.1 mg/dL (ref 8.7–10.2)
CHLORIDE: 99 mmol/L (ref 96–106)
CO2: 27 mmol/L (ref 20–29)
Creatinine, Ser: 0.93 mg/dL (ref 0.76–1.27)
GFR calc Af Amer: 109 mL/min/{1.73_m2} (ref >59)
GFR calc non Af Amer: 95 mL/min/{1.73_m2} (ref >59)
Glucose: 90 mg/dL (ref 65–99)
POTASSIUM: 4.7 mmol/L (ref 3.5–5.2)
SODIUM: 139 mmol/L (ref 134–144)

## 2018-01-15 LAB — CBC
Hematocrit: 43.3 % (ref 37.5–51.0)
Hemoglobin: 14.6 g/dL (ref 13.0–17.7)
MCH: 28.5 pg (ref 26.6–33.0)
MCHC: 33.7 g/dL (ref 31.5–35.7)
MCV: 84 fL (ref 79–97)
Platelets: 230 10*3/uL (ref 150–450)
RBC: 5.13 x10E6/uL (ref 4.14–5.80)
RDW: 14.5 % (ref 12.3–15.4)
WBC: 7.8 10*3/uL (ref 3.4–10.8)

## 2018-01-15 NOTE — Telephone Encounter (Signed)
Left detailed message per DPR:    [facility] for:  01/19/2018 at 9:30 am Verified arrival time and place:  Admitting at 7:30 am Confirmed AM meds to be taken pre-cath with sip of water: Take ASA Hold lasix (not on med list?) Confirmed patient has responsible person to drive home post procedure and observe patient for 24 hours:  yes

## 2018-01-16 ENCOUNTER — Encounter: Payer: Self-pay | Admitting: Cardiovascular Disease

## 2018-01-19 ENCOUNTER — Ambulatory Visit (HOSPITAL_COMMUNITY)
Admission: RE | Disposition: A | Discharge status: Home / Self Care | Payer: Self-pay | Source: Ambulatory Visit | Attending: Cardiovascular Disease

## 2018-01-19 ENCOUNTER — Ambulatory Visit (HOSPITAL_COMMUNITY)
Admission: RE | Admit: 2018-01-19 | Discharge: 2018-01-19 | Disposition: A | Discharge status: Home / Self Care | Payer: 59 | Source: Ambulatory Visit | Attending: Cardiovascular Disease | Admitting: Cardiovascular Disease

## 2018-01-19 DIAGNOSIS — R9439 Abnormal result of other cardiovascular function study: Secondary | ICD-10-CM | POA: Diagnosis not present

## 2018-01-19 DIAGNOSIS — Z951 Presence of aortocoronary bypass graft: Secondary | ICD-10-CM | POA: Diagnosis not present

## 2018-01-19 DIAGNOSIS — I2510 Atherosclerotic heart disease of native coronary artery without angina pectoris: Secondary | ICD-10-CM | POA: Diagnosis not present

## 2018-01-19 DIAGNOSIS — I2582 Chronic total occlusion of coronary artery: Secondary | ICD-10-CM | POA: Diagnosis not present

## 2018-01-19 DIAGNOSIS — Z952 Presence of prosthetic heart valve: Secondary | ICD-10-CM | POA: Diagnosis not present

## 2018-01-19 HISTORY — PX: CORONARY/GRAFT ANGIOGRAPHY: CATH118237

## 2018-01-19 SURGERY — CORONARY/GRAFT ANGIOGRAPHY
Anesthesia: LOCAL

## 2018-01-19 MED ORDER — SODIUM CHLORIDE 0.9 % IV SOLN: 250.0000 mL | INTRAVENOUS | Status: DC | PRN

## 2018-01-19 MED ORDER — SODIUM CHLORIDE 0.9% FLUSH: 3.0000 mL | INTRAVENOUS | Status: DC | PRN

## 2018-01-19 MED ORDER — LIDOCAINE HCL (PF) 1 % IJ SOLN
INTRAMUSCULAR | Status: DC | PRN
  Administered 2018-01-19: 30 mL via SUBCUTANEOUS

## 2018-01-19 MED ORDER — HEPARIN (PORCINE) IN NACL 1000-0.9 UT/500ML-% IV SOLN
INTRAVENOUS | Status: AC
  Filled 2018-01-19: qty 1000

## 2018-01-19 MED ORDER — SODIUM CHLORIDE 0.9 % IV SOLN: INTRAVENOUS | Status: AC

## 2018-01-19 MED ORDER — SODIUM CHLORIDE 0.9% FLUSH: 3.0000 mL | Freq: Two times a day (BID) | INTRAVENOUS | Status: DC

## 2018-01-19 MED ORDER — ONDANSETRON HCL 4 MG/2ML IJ SOLN
4.0000 mg | Freq: Four times a day (QID) | INTRAMUSCULAR | Status: DC | PRN
Start: 2018-01-19 — End: 2018-01-19

## 2018-01-19 MED ORDER — MORPHINE SULFATE (PF) 10 MG/ML IV SOLN: 2.0000 mg | INTRAVENOUS | Status: DC | PRN

## 2018-01-19 MED ORDER — ASPIRIN 81 MG PO CHEW: 81.0000 mg | CHEWABLE_TABLET | ORAL | Status: DC

## 2018-01-19 MED ORDER — IOHEXOL 350 MG/ML SOLN
INTRAVENOUS | Status: DC | PRN
  Administered 2018-01-19: 180 mL via INTRA_ARTERIAL

## 2018-01-19 MED ORDER — LIDOCAINE HCL (PF) 1 % IJ SOLN
INTRAMUSCULAR | Status: AC
  Filled 2018-01-19: qty 30

## 2018-01-19 MED ORDER — HEPARIN (PORCINE) IN NACL 2-0.9 UNITS/ML
INTRAMUSCULAR | Status: AC | PRN
  Administered 2018-01-19 (×2): 500 mL

## 2018-01-19 MED ORDER — SODIUM CHLORIDE 0.9 % WEIGHT BASED INFUSION
3.0000 mL/kg/h | INTRAVENOUS | Status: AC
  Administered 2018-01-19: 3 mL/kg/h via INTRAVENOUS

## 2018-01-19 MED ORDER — SODIUM CHLORIDE 0.9 % WEIGHT BASED INFUSION: 1.0000 mL/kg/h | INTRAVENOUS | Status: DC

## 2018-01-19 MED ORDER — ACETAMINOPHEN 325 MG PO TABS: 650.0000 mg | ORAL_TABLET | ORAL | Status: DC | PRN

## 2018-01-19 SURGICAL SUPPLY — 12 items
CATH INFINITI 5 FR LCB (CATHETERS) ×2 IMPLANT
CATH INFINITI 5 FR RCB (CATHETERS) ×2 IMPLANT
CATH INFINITI 5FR AL1 (CATHETERS) ×2 IMPLANT
CATH INFINITI 5FR MULTPACK ANG (CATHETERS) ×2 IMPLANT
DEVICE CLOSURE MYNXGRIP 5F (Vascular Products) ×2 IMPLANT
KIT HEART LEFT (KITS) ×2 IMPLANT
PACK CARDIAC CATHETERIZATION (CUSTOM PROCEDURE TRAY) ×2 IMPLANT
SHEATH AVANTI 11CM 5FR (SHEATH) ×2 IMPLANT
SYR MEDRAD MARK V 150ML (SYRINGE) ×2 IMPLANT
TRANSDUCER W/STOPCOCK (MISCELLANEOUS) ×2 IMPLANT
TUBING CIL FLEX 10 FLL-RA (TUBING) ×2 IMPLANT
WIRE EMERALD 3MM-J .035X150CM (WIRE) ×2 IMPLANT

## 2018-01-19 NOTE — Progress Notes (Signed)
Up and walked and tolerated well; right groin stable, no bleeding or hematoma 

## 2018-01-19 NOTE — Discharge Instructions (Signed)

## 2018-01-19 NOTE — Interval H&P Note (Signed)
Cath Lab Visit (complete for each Cath Lab visit)  Clinical Evaluation Leading to the Procedure:   ACS: No.  Non-ACS:    Anginal Classification: No Symptoms  Anti-ischemic medical therapy: Minimal Therapy (1 class of medications)  Non-Invasive Test Results: Intermediate-risk stress test findings: cardiac mortality 1-3%/year  Prior CABG: Previous CABG      History and Physical Interval Note:  01/19/2018 9:55 AM  Scott Shepard  has presented today for surgery, with the diagnosis of Abnormal Stress Test  The various methods of treatment have been discussed with the patient and family. After consideration of risks, benefits and other options for treatment, the patient has consented to  Procedure(s): LEFT HEART CATH AND CORS/GRAFTS ANGIOGRAPHY (N/A) as a surgical intervention .  The patient's history has been reviewed, patient examined, no change in status, stable for surgery.  I have reviewed the patient's chart and labs.  Questions were answered to the patient's satisfaction.     Nanetta BattyJonathan Nakeya Adinolfi

## 2018-01-20 ENCOUNTER — Encounter (HOSPITAL_COMMUNITY): Payer: Self-pay | Admitting: Cardiovascular Disease

## 2018-01-20 MED FILL — Heparin Sod (Porcine)-NaCl IV Soln 1000 Unit/500ML-0.9%: INTRAVENOUS | Qty: 1000 | Status: AC

## 2018-02-10 NOTE — Progress Notes (Signed)
Cardiology Office Note:    Date:  02/11/2018   ID:  Scott Shepard, DOB 08-Mar-1966, MRN [id number]  PCP:  System, Pcp Not In  Cardiologist:  Nanetta Batty, MD   Referring MD: No ref. provider found   Chief Complaint  Patient presents with  . Hospitalization Follow-up    CAD    History of Present Illness:    Scott Shepard is a 52 y.o. Filipino male with a hx of CAD with cath showing left main disease and is s/p CABG x 4 (06/06/17) with LIMA to LAD, SVG to OM, SVG to PDA, SVG to ramus branch, AS secondary to bicuspid aortic valve s/p AVR with tissue valve. He recently underwent nuclear stress test with myoview that showed possible ischemia. Decision was made to perform heart catheterization. Heart cath on 01/19/18 showed stable coronary anatomy, no intervention was made. Medical therapy recommended.  He tolerated the heart cath well, groin site C/D/I and was discharged as same-day PCI.    He returns today for follow up and is here with his wife. Since heart cath, he has done very well. Denies chest pain, SOB, palpitations, orthopnea, and lower extremity swelling. He would like to return to work. His employer would not allow him to return on light duty. He has been cleared by surgery to return to work    Past Medical History:  Diagnosis Date  . Aortic stenosis due to bicuspid aortic valve   . Coronary artery disease   . S/P AVR 06/06/2017  . S/P CABG x 4    06/06/17: LIMA-LAD, SVG-OM, SVG-PDA, SVG-ramus branch    Past Surgical History:  Procedure Laterality Date  . AORTIC VALVE REPLACEMENT (AVR)/CORONARY ARTERY BYPASS GRAFTING (CABG)  06/08/2017   LIMA to LAD, SVG to obtuse marginal branch, PDA to ramus branch.  23 mm InspirEase pericardial tissue valve  . CORONARY/GRAFT ANGIOGRAPHY N/A 01/19/2018   Procedure: CORONARY/GRAFT ANGIOGRAPHY;  Surgeon: Runell Gess, MD;  Location: [facility] INVASIVE CV LAB;  Service: Cardiovascular;  Laterality: N/A;  . Grade 2 diastolic function   08/19/2017   Per echo  . Hypercholesterolemia  05/2017    Current Medications: Current Meds  Medication Sig  . aspirin EC 81 MG tablet Take 81 mg by mouth daily.  Marland Kitchen atorvastatin (LIPITOR) 20 MG tablet Take 1 tablet (20 mg total) by mouth daily. (Patient taking differently: Take 20 mg by mouth at bedtime. )  . ferrous sulfate 325 (65 FE) MG tablet Take 325 mg by mouth 2 (two) times daily.   . folic acid (FOLVITE) 800 MCG tablet Take 800 mcg by mouth daily.  Marland Kitchen lisinopril (PRINIVIL,ZESTRIL) 5 MG tablet Take 5 mg by mouth daily.  . metoprolol succinate (TOPROL-XL) 50 MG 24 hr tablet Take 50 mg by mouth daily.      Allergies:   Patient has no known allergies.   Social History   Socioeconomic History  . Marital status: Married    Spouse name: Not on file  . Number of children: Not on file  . Years of education: Not on file  . Highest education level: Not on file  Occupational History  . Not on file  Social Needs  . Financial resource strain: Not on file  . Food insecurity:    Worry: Not on file    Inability: Not on file  . Transportation needs:    Medical: Not on file    Non-medical: Not on file  Tobacco Use  . Smoking status: Never Smoker  . Smokeless  tobacco: Never Used  Substance and Sexual Activity  . Alcohol use: Not on file  . Drug use: Not on file  . Sexual activity: Not on file  Lifestyle  . Physical activity:    Days per week: Not on file    Minutes per session: Not on file  . Stress: Not on file  Relationships  . Social connections:    Talks on phone: Not on file    Gets together: Not on file    Attends religious service: Not on file    Active member of club or organization: Not on file    Attends meetings of clubs or organizations: Not on file    Relationship status: Not on file  Other Topics Concern  . Not on file  Social History Narrative  . Not on file     Family History: The patient's family history includes Hypertension in his father.  ROS:     Please see the history of present illness.     All other systems reviewed and are negative.  EKGs/Labs/Other Studies Reviewed:    The following studies were reviewed today:  Heart cath 01/19/18:  Mid RCA lesion is 99% stenosed.  Ost LM to Mid LM lesion is 99% stenosed.  Ost Cx to Prox Cx lesion is 100% stenosed.  Ost LAD to Prox LAD lesion is 100% stenosed.  Origin lesion is 100% stenosed.  [name] has an occluded PDA vein graft at the aorta, patent LIMA to the LAD and patent sequential vein to an obtuse marginal branch and ramus branch.  He does have high-grade mid focal native RCA stenosis with left-to-right collaterals.  It is possible that his ischemic Myoview was related to a unbypassed diagonal branch.  I believe that his anatomy is stable.  He does have about a 50% anastomotic lesion within the LIMA graft in the mid LAD but does not appear to be physiologically significant.  This backfills the entire LAD as does the sequential vein graft backfill the circumflex to its origin.  Supravalvular aortography only demonstrated one vein graft originating from the aorta.  A right femoral angiogram was performed and a MYNX closure device was successfully deployed achieving hemostasis.  Patient left the lab in stable condition.  He will be discharged home in several hours as an outpatient and treated medically.  I will see him back in the office in several weeks for follow-up.    EKG:  EKG is ordered today.  The ekg ordered today demonstrates TWI in V4/5/6, unchanged from prior, sinus  Recent Labs: 10/13/2017: ALT 55 01/14/2018: BUN 13; Creatinine, Ser 0.93; Hemoglobin 14.6; Platelets 230; Potassium 4.7; Sodium 139  Recent Lipid Panel    Component Value Date/Time   CHOL 127 08/05/2017 0833   TRIG 75 08/05/2017 0833   HDL 39 (L) 08/05/2017 0833   CHOLHDL 3.3 08/05/2017 0833   LDLCALC 73 08/05/2017 0833    Physical Exam:    VS:  BP 122/72 (BP Location: Left Arm, Patient Position:  Sitting)   Pulse 71   Ht 5\' 6"  (1.676 m)   Wt 144 lb 9.6 oz (65.6 kg)   BMI 23.34 kg/m     Wt Readings from Last 3 Encounters:  02/11/18 144 lb 9.6 oz (65.6 kg)  01/19/18 140 lb (63.5 kg)  01/14/18 140 lb 12.8 oz (63.9 kg)     GEN: Well nourished, well developed in no acute distress HEENT: Normal NECK: No JVD; No carotid bruits LYMPHATICS: No lymphadenopathy CARDIAC: RRR,  no murmurs, rubs, gallops RESPIRATORY:  Clear to auscultation without rales, wheezing or rhonchi  ABDOMEN: Soft, non-tender, non-distended MUSCULOSKELETAL:  No edema; No deformity. Right groin site C/D/I SKIN: Warm and dry NEUROLOGIC:  Alert and oriented x 3 PSYCHIATRIC:  Normal affect   ASSESSMENT:    1. Coronary artery disease involving native coronary artery of native heart without angina pectoris   2. Hx of CABG   3. Aortic valve stenosis, severe   4. Hyperlipidemia LDL goal <70    PLAN:    In order of problems listed above:  Coronary artery disease involving native coronary artery of native heart without angina pectoris Hx of CABG Repeat heart cath 01/19/18 with stable coronary anatomy without intervention. BP is well-controlled. He is doing well on current medications without bleeding on ASA.   Aortic valve stenosis, severe, bicuspid valve, s/p AVR with tissue valve Stable.   HLD with goal LDL < 70 08/05/2017: Cholesterol, Total 127; HDL 39; LDL Calculated 73; Triglycerides 75 Continue statin Repeat blood work at next follow up visit.   No medication changes. I filled out his work form to return at full duty. Follow up with Dr. Allyson SabalBerry in 6 months, repeat lipids and LFTs at that time.   Medication Adjustments/Labs and Tests Ordered: Current medicines are reviewed at length with the patient today.  Concerns regarding medicines are outlined above.  Orders Placed This Encounter  Procedures  . EKG 12-Lead   No orders of the defined types were placed in this encounter.   Signed, Marcelino Dusterngela  Nicole Nesanel Aguila, [place]  02/11/2018 8:32 AM    [facility]

## 2018-02-11 ENCOUNTER — Encounter: Payer: Self-pay | Admitting: Physician Assistant

## 2018-02-11 ENCOUNTER — Ambulatory Visit (INDEPENDENT_AMBULATORY_CARE_PROVIDER_SITE_OTHER): Payer: 59 | Admitting: Physician Assistant

## 2018-02-11 VITALS — BP 122/72 | HR 71 | Ht 66.0 in | Wt 144.6 lb

## 2018-02-11 DIAGNOSIS — I350 Nonrheumatic aortic (valve) stenosis: Secondary | ICD-10-CM

## 2018-02-11 DIAGNOSIS — Z951 Presence of aortocoronary bypass graft: Secondary | ICD-10-CM

## 2018-02-11 DIAGNOSIS — I2510 Atherosclerotic heart disease of native coronary artery without angina pectoris: Secondary | ICD-10-CM | POA: Diagnosis not present

## 2018-02-11 DIAGNOSIS — E785 Hyperlipidemia, unspecified: Secondary | ICD-10-CM | POA: Diagnosis not present

## 2018-02-11 NOTE — Patient Instructions (Addendum)
Medication Instructions:  Your physician recommends that you continue on your current medications as directed. Please refer to the Current Medication list given to you today.   Labwork: None ordered  Testing/Procedures: None ordered  Follow-Up: Your physician wants you to follow-up in: 6 MONTHS WITH DR. BERRY   You will receive a reminder letter in the mail two months in advance. If you don't receive a letter, please call our office to schedule the follow-up appointment.   Any Other Special Instructions Will Be Listed Below (If Applicable).    If you need a refill on your cardiac medications before your next appointment, please call your pharmacy.   

## 2018-04-18 ENCOUNTER — Other Ambulatory Visit: Payer: Self-pay | Admitting: Cardiovascular Disease

## 2018-08-17 ENCOUNTER — Encounter: Payer: Self-pay | Admitting: Cardiovascular Disease

## 2018-09-04 ENCOUNTER — Ambulatory Visit (HOSPITAL_COMMUNITY): Payer: 59 | Attending: Cardiology

## 2018-09-04 ENCOUNTER — Encounter: Payer: Self-pay | Admitting: Cardiovascular Disease

## 2018-09-04 ENCOUNTER — Ambulatory Visit (INDEPENDENT_AMBULATORY_CARE_PROVIDER_SITE_OTHER): Payer: 59 | Admitting: Cardiovascular Disease

## 2018-09-04 ENCOUNTER — Encounter (INDEPENDENT_AMBULATORY_CARE_PROVIDER_SITE_OTHER): Payer: Self-pay

## 2018-09-04 VITALS — BP 148/94 | HR 61 | Ht 66.0 in | Wt 146.0 lb

## 2018-09-04 DIAGNOSIS — E785 Hyperlipidemia, unspecified: Secondary | ICD-10-CM | POA: Diagnosis not present

## 2018-09-04 DIAGNOSIS — I10 Essential (primary) hypertension: Secondary | ICD-10-CM | POA: Diagnosis not present

## 2018-09-04 DIAGNOSIS — I350 Nonrheumatic aortic (valve) stenosis: Secondary | ICD-10-CM | POA: Diagnosis not present

## 2018-09-04 DIAGNOSIS — Z951 Presence of aortocoronary bypass graft: Secondary | ICD-10-CM | POA: Diagnosis present

## 2018-09-04 DIAGNOSIS — I2510 Atherosclerotic heart disease of native coronary artery without angina pectoris: Secondary | ICD-10-CM | POA: Diagnosis present

## 2018-09-04 LAB — HEPATIC FUNCTION PANEL
ALBUMIN: 4.5 g/dL (ref 3.8–4.9)
ALK PHOS: 59 IU/L (ref 39–117)
ALT: 25 IU/L (ref 0–44)
AST: 24 IU/L (ref 0–40)
Bilirubin Total: 0.5 mg/dL (ref 0.0–1.2)
Bilirubin, Direct: 0.12 mg/dL (ref 0.00–0.40)
TOTAL PROTEIN: 7.3 g/dL (ref 6.0–8.5)

## 2018-09-04 LAB — ECHOCARDIOGRAM COMPLETE
HEIGHTINCHES: 66 in
WEIGHTICAEL: 2336 [oz_av]

## 2018-09-04 LAB — LIPID PANEL
CHOL/HDL RATIO: 2.9 ratio (ref 0.0–5.0)
Cholesterol, Total: 121 mg/dL (ref 100–199)
HDL: 42 mg/dL (ref >39)
LDL Calculated: 65 mg/dL (ref 0–99)
Triglycerides: 71 mg/dL (ref 0–149)
VLDL CHOLESTEROL CAL: 14 mg/dL (ref 5–40)

## 2018-09-04 MED ORDER — LISINOPRIL 5 MG PO TABS: 5.0000 mg | ORAL_TABLET | Freq: Every day | ORAL | 3 refills | Status: DC

## 2018-09-04 MED ORDER — METOPROLOL SUCCINATE ER 50 MG PO TB24: 50.0000 mg | ORAL_TABLET | Freq: Every day | ORAL | 3 refills | Status: DC

## 2018-09-04 NOTE — Assessment & Plan Note (Signed)
History of CAD status post CABG x411/18 in [place].  Because of an abnormal Myoview stress test I performed left heart cath on him 01/19/2018 revealing a patent LIMA to the LAD, patent sequential vein to the circumflex system and an occluded vein to the distal RCA for subtotally occluded RCA and left-to-right collaterals.  He is completely asymptomatic.  Medical therapy was recommended.

## 2018-09-04 NOTE — Patient Instructions (Signed)
Medication Instructions:  Your physician recommends that you continue on your current medications as directed. Please refer to the Current Medication list given to you today.  If you need a refill on your cardiac medications before your next appointment, please call your pharmacy.   Lab work: Your physician recommends that you return for lab work TODAY: LIPID AND LIVER PANELS If you have labs (blood work) drawn today and your tests are completely normal, you will receive your results only by: Marland Kitchen MyChart Message (if you have MyChart) OR . A paper copy in the mail If you have any lab test that is abnormal or we need to change your treatment, we will call you to review the results.  Testing/Procedures: NONE  Follow-Up: At [facility], you and your health needs are our priority.  As part of our continuing mission to provide you with exceptional heart care, we have created designated Provider Care Teams.  These Care Teams include your primary Cardiologist (physician) and Advanced Practice Providers (APPs -  Physician Assistants and Nurse Practitioners) who all work together to provide you with the care you need, when you need it. . You will need a follow up appointment in 12 months.  Please call our office 2 months in advance to schedule this appointment.  You may see Dr. Allyson Sabal or one of the following Advanced Practice Providers on your designated Care Team:   . Corine Shelter, [place] . Azalee Course, PA-C . Micah Flesher, PA-C . Joni Reining, DNP . Theodore Demark, PA-C . Judy Pimple, PA-C . Marjie Skiff, PA-C

## 2018-09-04 NOTE — Assessment & Plan Note (Signed)
History of essential hypertension with blood pressure measured today at 148/94.  He does admit to having run out of his beta-blocker and ACE inhibitor a month ago.

## 2018-09-04 NOTE — Assessment & Plan Note (Signed)
History of severe aortic stenosis status post 23 mm Inspiris pericardial tissue valve replacement at the time of bypass surgery.  He is scheduled for 2D echo today.

## 2018-09-04 NOTE — Progress Notes (Signed)
09/04/2018 Scott Shepard   1966-03-12  [id number]  Primary Physician System, Pcp Not In Primary Cardiologist: Scott Gess MD Scott Shepard, Scott Shepard, [place]  HPI:  Scott Shepard is a 53 y.o.  thin-appearing married Filipino male father of one daughter   I last saw him in the office 12/30/2017.  He was referred to be established in my card and asked practice for ongoing care after recent coronary artery bypass grafting and aortic valve replacement. He has no prior cardiac history. He has no cardiac risk factors. He works at [place] at [employer] and commutes back and forth every 2 weeks. He noticed increasing shortness of breath and was seen by cardiologist there (Dr. Shawna Shepard) who performed cardiac catheterization on 06/03/17 revealing left main/three-vessel disease. A 2-D echo did reveal severe bicuspid aortic stenosis. Previously on 06/06/17 he had coronary artery bypass grafting 4 with a LIMA to his LAD, vein to obtuse marginal branch, PDA and ramus branch. He also had a 23 mmInspirispericardial tissue valve placed. He completed cardiac rehabilitation successfully. His breathing has improved since his surgical procedure. He currently denies chest pain or shortness of breath. 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  He recently had a 2D echo performed by his cardiologist up there that was essentially normal however a Myoview stress test showed anteroapical ischemia despite being asymptomatic.  Because of the abnormal Myoview stress test I performed outpatient cardiac catheterization on him 01/19/2018 revealing a patent LIMA to the LAD, patent sequential vein to the circumflex system with occluded native vessels, occluded left main and an occluded vein graft to the RCA with left-to-right collaterals.  He is completely asymptomatic.  He unfortunately ran out of his medications a month ago and has elevated blood pressure today.   Current Meds  Medication Sig    . aspirin EC 81 MG tablet Take 81 mg by mouth daily.  Marland Kitchen atorvastatin (LIPITOR) 20 MG tablet Take 1 tablet (20 mg total) by mouth at bedtime.  . ferrous sulfate 325 (65 FE) MG tablet Take 325 mg by mouth 2 (two) times daily.   . folic acid (FOLVITE) 800 MCG tablet Take 800 mcg by mouth daily.  Marland Kitchen lisinopril (PRINIVIL,ZESTRIL) 5 MG tablet Take 1 tablet (5 mg total) by mouth daily.  . metoprolol succinate (TOPROL-XL) 50 MG 24 hr tablet Take 1 tablet (50 mg total) by mouth daily.  . [DISCONTINUED] lisinopril (PRINIVIL,ZESTRIL) 5 MG tablet Take 5 mg by mouth daily.  . [DISCONTINUED] metoprolol succinate (TOPROL-XL) 50 MG 24 hr tablet Take 50 mg by mouth daily.      No Known Allergies  Social History   Socioeconomic History  . Marital status: Married    Spouse name: Not on file  . Number of children: Not on file  . Years of education: Not on file  . Highest education level: Not on file  Occupational History  . Not on file  Social Needs  . Financial resource strain: Not on file  . Food insecurity:    Worry: Not on file    Inability: Not on file  . Transportation needs:    Medical: Not on file    Non-medical: Not on file  Tobacco Use  . Smoking status: Never Smoker  . Smokeless tobacco: Never Used  Substance and Sexual Activity  . Alcohol use: Not on file  . Drug use: Not on file  . Sexual activity: Not on file  Lifestyle  . Physical activity:  Days per week: Not on file    Minutes per session: Not on file  . Stress: Not on file  Relationships  . Social connections:    Talks on phone: Not on file    Gets together: Not on file    Attends religious service: Not on file    Active member of club or organization: Not on file    Attends meetings of clubs or organizations: Not on file    Relationship status: Not on file  . Intimate partner violence:    Fear of current or ex partner: Not on file    Emotionally abused: Not on file    Physically abused: Not on file    Forced  sexual activity: Not on file  Other Topics Concern  . Not on file  Social History Narrative  . Not on file     Review of Systems: General: negative for chills, fever, night sweats or weight changes.  Cardiovascular: negative for chest pain, dyspnea on exertion, edema, orthopnea, palpitations, paroxysmal nocturnal dyspnea or shortness of breath Dermatological: negative for rash Respiratory: negative for cough or wheezing Urologic: negative for hematuria Abdominal: negative for nausea, vomiting, diarrhea, bright red blood per rectum, melena, or hematemesis Neurologic: negative for visual changes, syncope, or dizziness All other systems reviewed and are otherwise negative except as noted above.    Blood pressure (!) 148/94, pulse 61, height 5\' 6"  (1.676 m), weight 146 lb (66.2 kg).  General appearance: alert and no distress Neck: no adenopathy, no carotid bruit, no JVD, supple, symmetrical, trachea midline and thyroid not enlarged, symmetric, no tenderness/mass/nodules Lungs: clear to auscultation bilaterally Heart: Soft outflow tract murmur consistent with aortic stenosis. Extremities: extremities normal, atraumatic, no cyanosis or edema Pulses: 2+ and symmetric Skin: Skin color, texture, turgor normal. No rashes or lesions Neurologic: Alert and oriented X 3, normal strength and tone. Normal symmetric reflexes. Normal coordination and gait  EKG sinus rhythm at 61 with evidence of LVH with repolarization changes.  I personally reviewed this EKG.  ASSESSMENT AND PLAN:   Hx of CABG History of CAD status post CABG [phone]/18 in [place].  Because of an abnormal Myoview stress test I performed left heart cath on him 01/19/2018 revealing a patent LIMA to the LAD, patent sequential vein to the circumflex system and an occluded vein to the distal RCA for subtotally occluded RCA and left-to-right collaterals.  He is completely asymptomatic.  Medical therapy was recommended.  Aortic valve  stenosis, severe History of severe aortic stenosis status post 23 mm Inspiris pericardial tissue valve replacement at the time of bypass surgery.  He is scheduled for 2D echo today.  Hyperlipidemia History of hyperlipidemia on statin therapy with lipid profile performed 08/05/2017 revealing total cholesterol 127, LDL 73 and HDL of 39.  We will check a lipid and liver profile today.  Essential hypertension History of essential hypertension with blood pressure measured today at 148/94.  He does admit to having run out of his beta-blocker and ACE inhibitor a month ago.      Scott Gess MD FACP,FACC,FAHA, [facility] 09/04/2018 10:12 AM

## 2018-09-04 NOTE — Assessment & Plan Note (Signed)
History of hyperlipidemia on statin therapy with lipid profile performed 08/05/2017 revealing total cholesterol 127, LDL 73 and HDL of 39.  We will check a lipid and liver profile today.

## 2018-09-08 ENCOUNTER — Encounter: Payer: Self-pay | Admitting: *Deleted

## 2019-01-08 ENCOUNTER — Other Ambulatory Visit: Payer: Self-pay | Admitting: Cardiovascular Disease

## 2019-04-27 IMAGING — CR DG CHEST 2V
2 series · 2 of 2 positions shown · non-contrast
Comparison: None.

CLINICAL DATA: Abnormal nuclear stress test.

EXAM:
CHEST - 2 VIEW

[w chest pa]
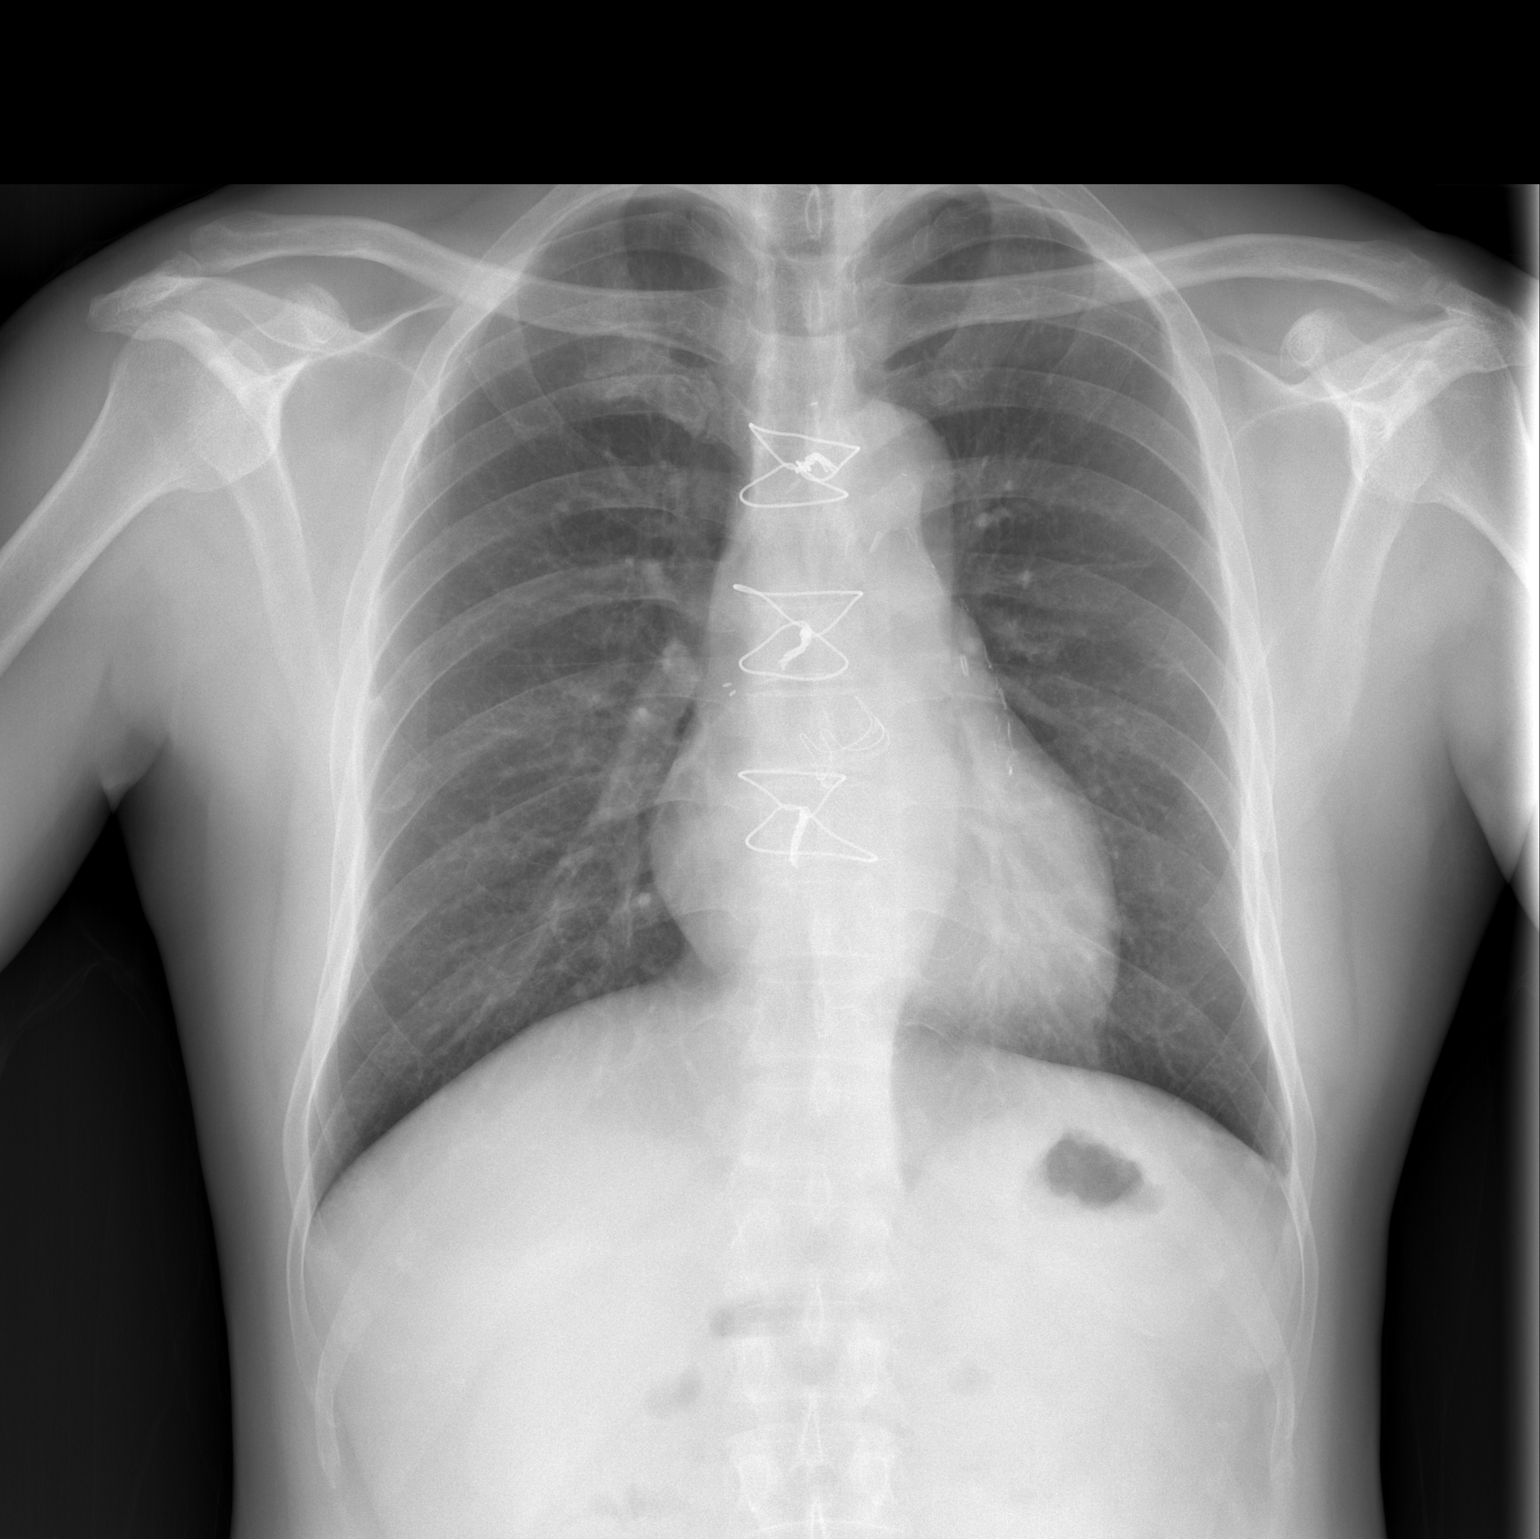

[w chest lat]
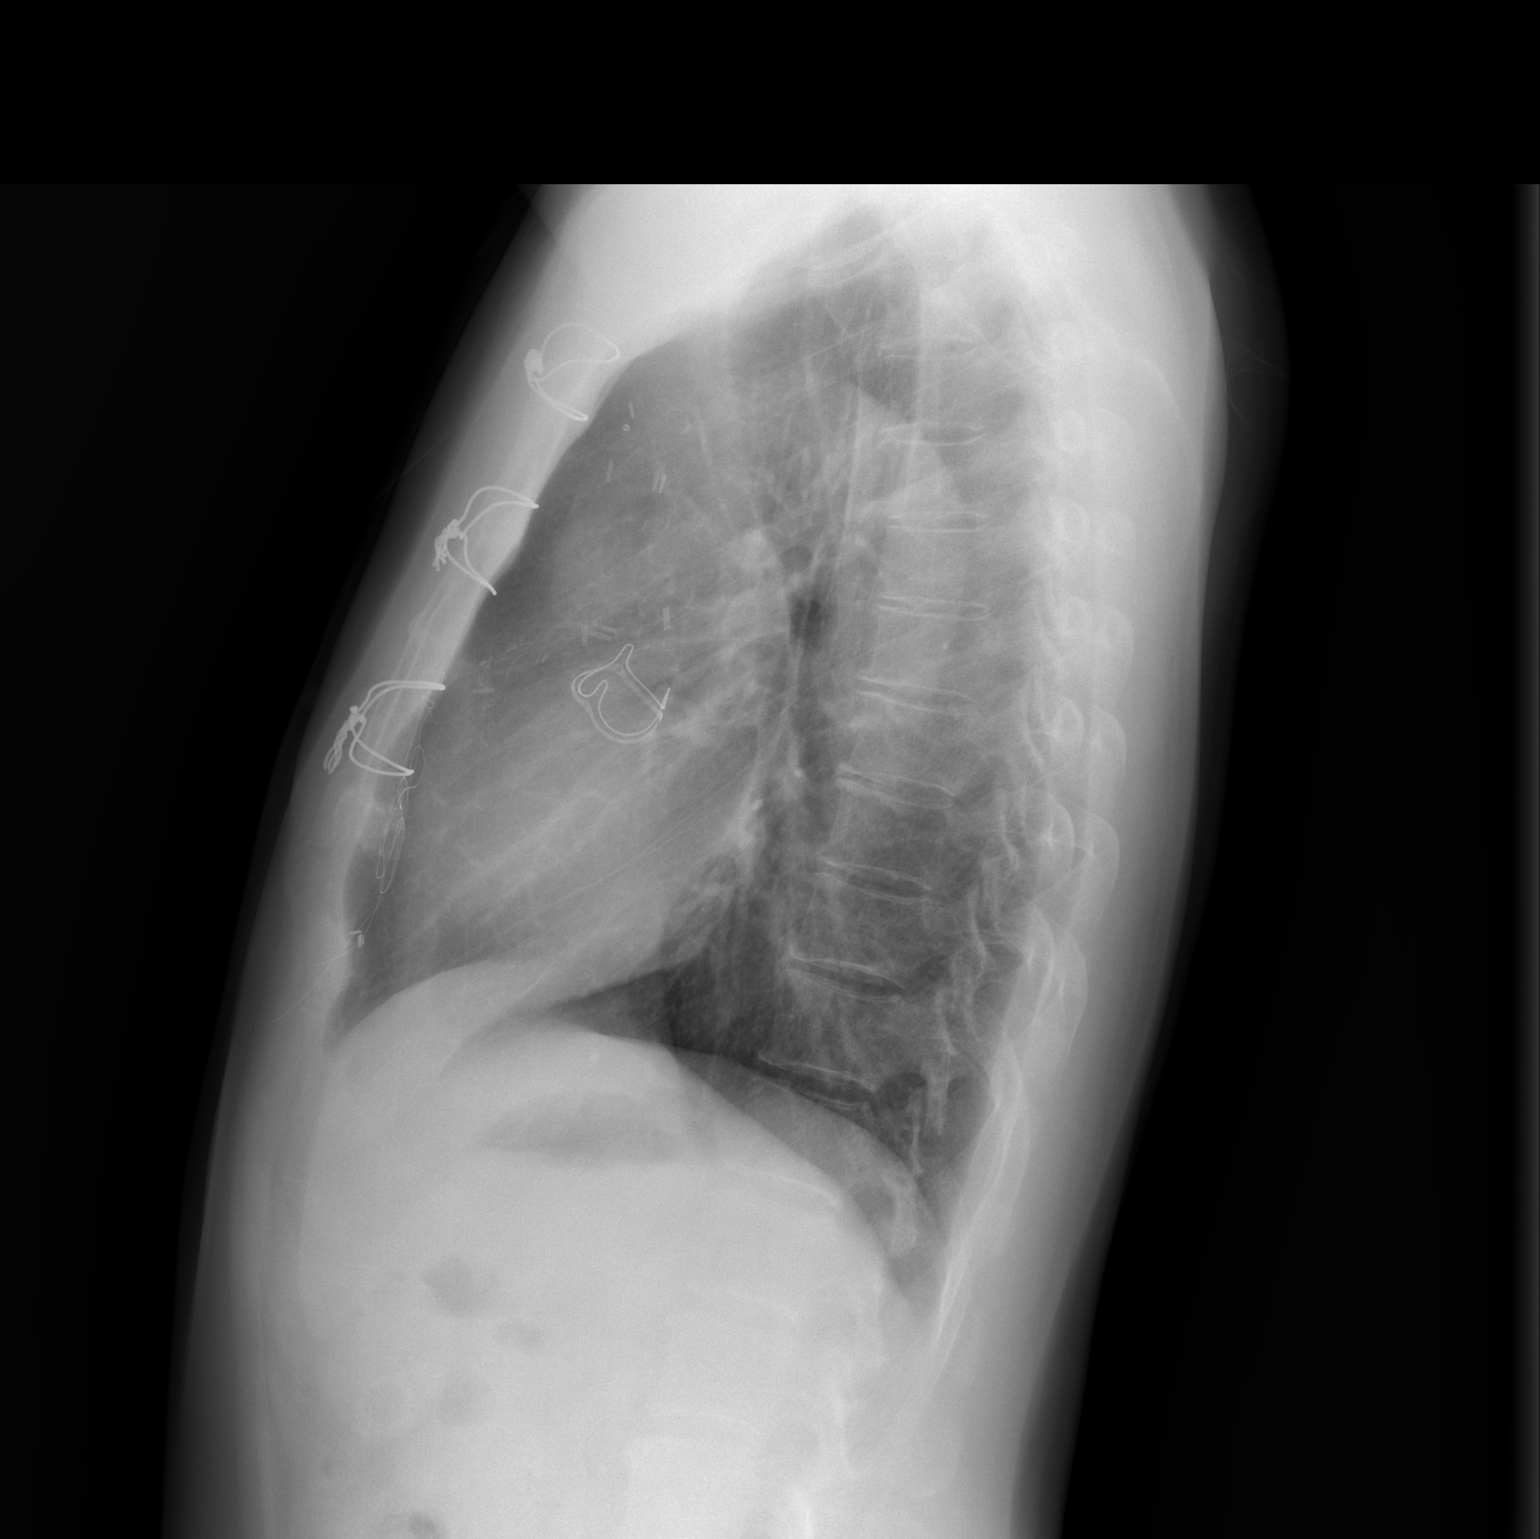

[2 of 2 positions shown; findings below may reference images not displayed]

FINDINGS: Previous CABG. Previous valve replacement. The heart, hila,
mediastinum, lungs, and pleura are otherwise normal.
IMPRESSION: No active cardiopulmonary disease.

## 2019-08-15 ENCOUNTER — Other Ambulatory Visit: Payer: Self-pay | Admitting: Cardiovascular Disease

## 2019-11-13 ENCOUNTER — Other Ambulatory Visit: Payer: Self-pay | Admitting: Cardiovascular Disease

## 2019-11-16 NOTE — Telephone Encounter (Signed)
Rx(s) sent to pharmacy electronically.  

## 2019-12-14 ENCOUNTER — Other Ambulatory Visit: Payer: Self-pay | Admitting: Cardiovascular Disease

## 2020-02-02 ENCOUNTER — Other Ambulatory Visit: Payer: Self-pay | Admitting: Cardiovascular Disease

## 2020-02-18 ENCOUNTER — Other Ambulatory Visit: Payer: Self-pay | Admitting: Cardiovascular Disease

## 2020-05-08 ENCOUNTER — Other Ambulatory Visit: Payer: Self-pay | Admitting: Cardiovascular Disease

## 2020-05-15 ENCOUNTER — Other Ambulatory Visit: Payer: Self-pay | Admitting: Cardiovascular Disease

## 2020-07-17 ENCOUNTER — Other Ambulatory Visit: Payer: Self-pay | Admitting: Cardiovascular Disease

## 2020-07-22 ENCOUNTER — Other Ambulatory Visit: Payer: Self-pay | Admitting: Cardiovascular Disease

## 2020-08-16 ENCOUNTER — Other Ambulatory Visit: Payer: Self-pay | Admitting: Cardiovascular Disease

## 2020-08-19 ENCOUNTER — Other Ambulatory Visit: Payer: Self-pay | Admitting: Cardiovascular Disease

## 2020-09-15 ENCOUNTER — Other Ambulatory Visit: Payer: Self-pay | Admitting: Cardiovascular Disease

## 2020-09-18 ENCOUNTER — Other Ambulatory Visit: Payer: Self-pay | Admitting: Cardiovascular Disease
